# Patient Record
Sex: Female | Born: 2005 | Race: White | Hispanic: No | Marital: Single | Smoking: Never smoker
Health system: Southern US, Community
[De-identification: ages and names within clinical notes are randomized; demographics above are authoritative.]

## PROBLEM LIST (undated history)

## (undated) DIAGNOSIS — R51 Headache: Secondary | ICD-10-CM

## (undated) DIAGNOSIS — J45909 Unspecified asthma, uncomplicated: Secondary | ICD-10-CM

## (undated) DIAGNOSIS — R519 Headache, unspecified: Secondary | ICD-10-CM

## (undated) HISTORY — PX: NO PAST SURGERIES: SHX2092

## (undated) HISTORY — DX: Headache, unspecified: R51.9

## (undated) HISTORY — DX: Headache: R51

---
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
## 2023-04-25 DIAGNOSIS — N764 Abscess of vulva: Secondary | ICD-10-CM | POA: Diagnosis not present

## 2023-04-25 DIAGNOSIS — H6013 Cellulitis of external ear, bilateral: Secondary | ICD-10-CM | POA: Diagnosis not present

## 2023-05-10 DIAGNOSIS — J028 Acute pharyngitis due to other specified organisms: Secondary | ICD-10-CM | POA: Diagnosis not present

## 2023-05-11 DIAGNOSIS — J029 Acute pharyngitis, unspecified: Secondary | ICD-10-CM | POA: Diagnosis not present

## 2023-05-11 DIAGNOSIS — Z20822 Contact with and (suspected) exposure to covid-19: Secondary | ICD-10-CM | POA: Diagnosis not present

## 2023-05-11 DIAGNOSIS — R509 Fever, unspecified: Secondary | ICD-10-CM | POA: Diagnosis not present

## 2023-05-13 DIAGNOSIS — R110 Nausea: Secondary | ICD-10-CM | POA: Diagnosis not present

## 2023-05-13 DIAGNOSIS — R519 Headache, unspecified: Secondary | ICD-10-CM | POA: Diagnosis not present

## 2023-05-13 DIAGNOSIS — R509 Fever, unspecified: Secondary | ICD-10-CM | POA: Diagnosis not present

## 2023-05-13 DIAGNOSIS — J029 Acute pharyngitis, unspecified: Secondary | ICD-10-CM | POA: Diagnosis not present

## 2023-07-01 DIAGNOSIS — J069 Acute upper respiratory infection, unspecified: Secondary | ICD-10-CM | POA: Diagnosis not present

## 2023-07-01 DIAGNOSIS — Z1389 Encounter for screening for other disorder: Secondary | ICD-10-CM | POA: Diagnosis not present

## 2023-07-01 DIAGNOSIS — Z20822 Contact with and (suspected) exposure to covid-19: Secondary | ICD-10-CM | POA: Diagnosis not present

## 2023-07-01 DIAGNOSIS — L02412 Cutaneous abscess of left axilla: Secondary | ICD-10-CM | POA: Diagnosis not present

## 2023-07-01 DIAGNOSIS — J029 Acute pharyngitis, unspecified: Secondary | ICD-10-CM | POA: Diagnosis not present

## 2023-08-17 DIAGNOSIS — R4182 Altered mental status, unspecified: Secondary | ICD-10-CM | POA: Diagnosis not present

## 2023-08-17 DIAGNOSIS — F10129 Alcohol abuse with intoxication, unspecified: Secondary | ICD-10-CM | POA: Diagnosis not present

## 2023-08-18 DIAGNOSIS — F10920 Alcohol use, unspecified with intoxication, uncomplicated: Secondary | ICD-10-CM | POA: Diagnosis not present

## 2023-08-18 DIAGNOSIS — F10129 Alcohol abuse with intoxication, unspecified: Secondary | ICD-10-CM | POA: Diagnosis not present

## 2023-10-18 DIAGNOSIS — R519 Headache, unspecified: Secondary | ICD-10-CM | POA: Diagnosis not present

## 2023-11-20 DIAGNOSIS — J0300 Acute streptococcal tonsillitis, unspecified: Secondary | ICD-10-CM | POA: Diagnosis not present

## 2023-12-03 DIAGNOSIS — L02214 Cutaneous abscess of groin: Secondary | ICD-10-CM | POA: Diagnosis not present

## 2023-12-03 DIAGNOSIS — N898 Other specified noninflammatory disorders of vagina: Secondary | ICD-10-CM | POA: Diagnosis not present

## 2023-12-03 DIAGNOSIS — Z113 Encounter for screening for infections with a predominantly sexual mode of transmission: Secondary | ICD-10-CM | POA: Diagnosis not present

## 2023-12-03 DIAGNOSIS — A4902 Methicillin resistant Staphylococcus aureus infection, unspecified site: Secondary | ICD-10-CM | POA: Diagnosis not present

## 2024-06-07 DIAGNOSIS — J020 Streptococcal pharyngitis: Secondary | ICD-10-CM | POA: Diagnosis not present

## 2024-06-07 DIAGNOSIS — J029 Acute pharyngitis, unspecified: Secondary | ICD-10-CM | POA: Diagnosis not present

## 2024-06-07 DIAGNOSIS — R590 Localized enlarged lymph nodes: Secondary | ICD-10-CM | POA: Diagnosis not present

## 2024-08-10 DIAGNOSIS — L02412 Cutaneous abscess of left axilla: Secondary | ICD-10-CM | POA: Diagnosis not present

## 2024-08-18 DIAGNOSIS — Z202 Contact with and (suspected) exposure to infections with a predominantly sexual mode of transmission: Secondary | ICD-10-CM | POA: Diagnosis not present

## 2024-09-09 DIAGNOSIS — Z20828 Contact with and (suspected) exposure to other viral communicable diseases: Secondary | ICD-10-CM | POA: Diagnosis not present

## 2024-09-09 DIAGNOSIS — R070 Pain in throat: Secondary | ICD-10-CM | POA: Diagnosis not present

## 2024-09-09 DIAGNOSIS — R509 Fever, unspecified: Secondary | ICD-10-CM | POA: Diagnosis not present

## 2024-09-09 DIAGNOSIS — R519 Headache, unspecified: Secondary | ICD-10-CM | POA: Diagnosis not present

## 2024-10-13 DIAGNOSIS — L700 Acne vulgaris: Secondary | ICD-10-CM | POA: Diagnosis not present

## 2024-10-20 ENCOUNTER — Other Ambulatory Visit: Payer: Self-pay

## 2024-10-20 ENCOUNTER — Telehealth: Payer: Self-pay | Admitting: Pediatrics

## 2024-10-20 ENCOUNTER — Ambulatory Visit: Admission: EM | Admit: 2024-10-20 | Discharge: 2024-10-20 | Disposition: A | Discharge status: Home / Self Care

## 2024-10-20 DIAGNOSIS — H00015 Hordeolum externum left lower eyelid: Secondary | ICD-10-CM | POA: Diagnosis not present

## 2024-10-20 DIAGNOSIS — H00035 Abscess of left lower eyelid: Secondary | ICD-10-CM

## 2024-10-20 MED ORDER — CEPHALEXIN 500 MG PO CAPS: 500.0000 mg | ORAL_CAPSULE | Freq: Four times a day (QID) | ORAL | 0 refills | Status: AC

## 2024-10-20 NOTE — ED Triage Notes (Signed)
 Pt presents with a chief complaint of left eye pain x 5 days. Believes it may be a stye on the lower left eyelid. Does endorse some blurred vision in L eye. Rates overall pain an 8/10. Redness noted under the L eye on assessment. No OTC medications taken or applied to L eye PTA for sxs/pain. Denies sick contacts.

## 2024-10-20 NOTE — Telephone Encounter (Signed)
 Copied from CRM [id number]. Topic: Appointments - Scheduling Inquiry for Clinic >> Oct 20, 2024 12:59 PM Chasity T wrote: Reason for CRM: [name] mother of patient is wanting her to be seen my jessica copland. She is currently a patient of her as well. Please contact mother to schedule appointment if Dr Watt approves.  Phone number:  [phone]

## 2024-10-20 NOTE — Telephone Encounter (Signed)
 Called pt lvm to call back to schedule new pt appt

## 2024-10-20 NOTE — ED Provider Notes (Signed)
 " [facility]    CSN: [id number] Arrival date & time: 10/20/24  1643      History   Chief Complaint Chief Complaint  Patient presents with   Eye Pain    HPI Kayla Warner is a 19 y.o. female.   HPI  Pt is here today for concerns of a stye on her left eye that has been present since Thurs, 10/16/24. She denies vision changes but states the stye is in her field of vision which sometimes obscures things.  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      Home Medications    Prior to Admission medications  Medication Sig Start Date End Date Taking? Authorizing Provider  cephALEXin  (KEFLEX ) 500 MG capsule Take 1 capsule (500 mg total) by mouth 4 (four) times daily for 7 days. 10/20/24 10/27/24 Yes Elliot Meldrum E, PA-C  RETIN-A 0.025 % cream Apply topically at bedtime. 10/13/24  Yes [provider]  albuterol (VENTOLIN HFA) 108 (90 Base) MCG/ACT inhaler Inhale 2 puffs into the lungs every 4 (four) hours as needed for shortness of breath. take prior to excercise 07/11/16   [provider]  ibuprofen (ADVIL,MOTRIN) 200 MG tablet Take 200 mg by mouth every 6 (six) hours as needed for headache.    [provider]    Family History Family History  Problem Relation Age of Onset   Migraines Mother    Depression Mother    Anxiety disorder Mother    Migraines Father    Depression Maternal Aunt    Anxiety disorder Maternal Aunt    Bipolar disorder Maternal Aunt    Depression Maternal Uncle     Anxiety disorder Maternal Uncle    Cancer Maternal Grandmother    Migraines Maternal Grandmother    Anxiety disorder Maternal Grandmother    Depression Maternal Grandmother     Social History Social History[1]   Allergies   Doxycycline   Review of Systems Review of Systems  Eyes:  Positive for pain and redness. Negative for visual disturbance.     Physical Exam Triage Vital Signs ED Triage Vitals  Encounter Vitals Group     BP 10/20/24 1825 118/81     Girls Systolic BP Percentile --      Girls Diastolic BP Percentile --      Boys Systolic BP Percentile --      Boys Diastolic BP Percentile --      Pulse Rate 10/20/24 1825 74     Resp 10/20/24 1825 16     Temp 10/20/24 1825 98 F (36.7 C)     Temp Source 10/20/24 1825 Oral     SpO2 10/20/24 1825 98 %     Weight --      Height --      Head Circumference --      Peak Flow --      Pain Score 10/20/24 1823 8     Pain Loc --  Pain Education --      Exclude from Growth Chart --    No data found.  Updated Vital Signs BP 118/81 (BP Location: Right Arm)   Pulse 74   Temp 98 F (36.7 C) (Oral)   Resp 16   LMP 10/04/2024 (Exact Date)   SpO2 98%   Visual Acuity Right Eye Distance: 20/20 Left Eye Distance: 20/20 Bilateral Distance: 20/20 (pt denies wearing glasses or contacts.)  Right Eye Near:   Left Eye Near:    Bilateral Near:     Physical Exam Vitals reviewed.  Constitutional:      General: She is awake.     Appearance: Normal appearance. She is well-developed and well-groomed.  HENT:     Head: Normocephalic and atraumatic.  Eyes:     General: Lids are normal. Gaze aligned appropriately.        Left eye: Hordeolum present.No foreign body or discharge.     Extraocular Movements: Extraocular movements intact.     Right eye: Normal extraocular motion and no nystagmus.     Left eye: Normal extraocular motion and no nystagmus.     Conjunctiva/sclera: Conjunctivae normal.     Pupils: Pupils are equal,  round, and reactive to light.   Pulmonary:     Effort: Pulmonary effort is normal.  Neurological:     Mental Status: She is alert and oriented to person, place, and time.  Psychiatric:        Attention and Perception: Attention and perception normal.        Mood and Affect: Mood and affect normal.        Speech: Speech normal.        Behavior: Behavior normal. Behavior is cooperative.      UC Treatments / Results  Labs (all labs ordered are listed, but only abnormal results are displayed) Labs Reviewed - No data to display  EKG   Radiology No results found.  Procedures Procedures (including critical care time)  Medications Ordered in UC Medications - No data to display  Initial Impression / Assessment and Plan / UC Course  I have reviewed the triage vital signs and the nursing notes.  Pertinent labs & imaging results that were available during my care of the patient were reviewed by me and considered in my medical decision making (see chart for details).      Final Clinical Impressions(s) / UC Diagnoses   Final diagnoses:  Hordeolum externum of left lower eyelid  Abscess of left lower eyelid   Patient is here today with concerns for a stye on her left lower eye that has been present since 10/16/2024.  She reports that this is not improving despite warm compresses to the eye.  Physical exam demonstrates swelling along the entire left lower lash line with area of obvious fluctuance.  Given persistence of symptoms and lack of improvement I am concerned for potential abscess versus stye.  Will start patient on Keflex  p.o. 4 times daily x 7 days to assist with management.  Reviewed that if symptoms are not improving or seem to be worsening she should follow-up with ophthalmology urgently.  Patient and her mother voiced agreement understanding with recommendations.  Follow-up as needed.     Discharge Instructions      You were seen today for concerns of a stye on your  left lower eyelid.  Your physical exam does have some components consistent with a stye but I am also concerned that you may have an infection  in this area that I am going to treat with an antibiotic.  I am starting you on a antibiotic called Keflex  for you to take by mouth 4 times a day for 7 days.  Please make sure that you finish the entire course of the antibiotic unless directed by a medical provider to stop or if you develop an allergic reaction.  Please continue to use warm compresses on the area to help encourage drainage.  Please do not squeeze or manipulate the area as this can cause further irritation as well as swelling or drive the infection deeper into the skin.  You can alternate Tylenol  and ibuprofen as needed for pain control.  If you feel like your symptoms are not improving or seem to be worsening I recommend close follow-up with ophthalmology for further evaluation and ongoing management.     ED Prescriptions     Medication Sig Dispense Auth. Provider   cephALEXin  (KEFLEX ) 500 MG capsule Take 1 capsule (500 mg total) by mouth 4 (four) times daily for 7 days. 28 capsule Patrece Tallie E, PA-C      PDMP not reviewed this encounter.     [1]  Social History Tobacco Use   Smoking status: Never   Smokeless tobacco: Never  Vaping Use   Vaping status: Never Used  Substance Use Topics   Alcohol use: No   Drug use: No     Kejuan Bekker, [place] BRAVO, PA-C 10/20/24 1929  "

## 2024-10-20 NOTE — Discharge Instructions (Addendum)
 You were seen today for concerns of a stye on your left lower eyelid.  Your physical exam does have some components consistent with a stye but I am also concerned that you may have an infection in this area that I am going to treat with an antibiotic.  I am starting you on a antibiotic called Keflex  for you to take by mouth 4 times a day for 7 days.  Please make sure that you finish the entire course of the antibiotic unless directed by a medical provider to stop or if you develop an allergic reaction.  Please continue to use warm compresses on the area to help encourage drainage.  Please do not squeeze or manipulate the area as this can cause further irritation as well as swelling or drive the infection deeper into the skin.  You can alternate Tylenol  and ibuprofen as needed for pain control.  If you feel like your symptoms are not improving or seem to be worsening I recommend close follow-up with ophthalmology for further evaluation and ongoing management.

## 2024-10-20 NOTE — Telephone Encounter (Signed)
 Pt called back tried to schedule but pts mom got mad and hung up because pt needed to be seen today but there was no openings
